# Patient Record
Sex: Female | Born: 1990 | Race: White | Hispanic: No | Marital: Single | Smoking: Current every day smoker
Health system: Southern US, Community
[De-identification: ages and names within clinical notes are randomized; demographics above are authoritative.]

## PROBLEM LIST (undated history)

## (undated) DIAGNOSIS — F419 Anxiety disorder, unspecified: Secondary | ICD-10-CM

## (undated) DIAGNOSIS — J45909 Unspecified asthma, uncomplicated: Secondary | ICD-10-CM

## (undated) HISTORY — PX: APPENDECTOMY: SHX54

---
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
## 2012-04-25 ENCOUNTER — Emergency Department: Payer: Self-pay | Admitting: Emergency Medicine

## 2012-04-25 LAB — URINALYSIS, COMPLETE
Bacteria: NONE SEEN
Blood: NEGATIVE
Glucose,UR: NEGATIVE mg/dL (ref 0–75)
Ph: 6 (ref 4.5–8.0)
Protein: NEGATIVE
RBC,UR: 1 /HPF (ref 0–5)
Specific Gravity: 1.02 (ref 1.003–1.030)
Squamous Epithelial: 3
WBC UR: 1 /HPF (ref 0–5)

## 2012-04-25 LAB — WET PREP, GENITAL

## 2012-08-25 ENCOUNTER — Emergency Department (HOSPITAL_COMMUNITY)
Admission: EM | Admit: 2012-08-25 | Discharge: 2012-08-25 | Disposition: A | Discharge status: Home / Self Care | Payer: Self-pay | Attending: Emergency Medicine | Admitting: Emergency Medicine

## 2012-08-25 ENCOUNTER — Encounter (HOSPITAL_COMMUNITY): Payer: Self-pay

## 2012-08-25 DIAGNOSIS — J45909 Unspecified asthma, uncomplicated: Secondary | ICD-10-CM | POA: Insufficient documentation

## 2012-08-25 DIAGNOSIS — F17200 Nicotine dependence, unspecified, uncomplicated: Secondary | ICD-10-CM | POA: Insufficient documentation

## 2012-08-25 DIAGNOSIS — Z3202 Encounter for pregnancy test, result negative: Secondary | ICD-10-CM | POA: Insufficient documentation

## 2012-08-25 DIAGNOSIS — N949 Unspecified condition associated with female genital organs and menstrual cycle: Secondary | ICD-10-CM | POA: Insufficient documentation

## 2012-08-25 DIAGNOSIS — N760 Acute vaginitis: Secondary | ICD-10-CM | POA: Insufficient documentation

## 2012-08-25 DIAGNOSIS — N898 Other specified noninflammatory disorders of vagina: Secondary | ICD-10-CM | POA: Insufficient documentation

## 2012-08-25 DIAGNOSIS — A499 Bacterial infection, unspecified: Secondary | ICD-10-CM | POA: Insufficient documentation

## 2012-08-25 DIAGNOSIS — B9689 Other specified bacterial agents as the cause of diseases classified elsewhere: Secondary | ICD-10-CM | POA: Insufficient documentation

## 2012-08-25 HISTORY — DX: Unspecified asthma, uncomplicated: J45.909

## 2012-08-25 LAB — URINALYSIS, ROUTINE W REFLEX MICROSCOPIC
Glucose, UA: NEGATIVE mg/dL
Hgb urine dipstick: NEGATIVE
Hgb urine dipstick: NEGATIVE
Leukocytes, UA: NEGATIVE
Protein, ur: NEGATIVE mg/dL
Specific Gravity, Urine: >1.03 — ABNORMAL HIGH (ref 1.005–1.030)
Specific Gravity, Urine: 1.025 (ref 1.005–1.030)
pH: 6 (ref 5.0–8.0)
pH: 7 (ref 5.0–8.0)

## 2012-08-25 LAB — WET PREP, GENITAL: Trich, Wet Prep: NONE SEEN

## 2012-08-25 LAB — URINE MICROSCOPIC-ADD ON

## 2012-08-25 MED ORDER — NAPROXEN 500 MG PO TABS: 500.0000 mg | ORAL_TABLET | Freq: Two times a day (BID) | ORAL | Status: DC

## 2012-08-25 MED ORDER — METRONIDAZOLE 500 MG PO TABS: 500.0000 mg | ORAL_TABLET | Freq: Two times a day (BID) | ORAL | Status: DC

## 2012-08-25 NOTE — ED Notes (Signed)
States that she is having burning with urination.  States that she is having clear-yellow vaginal discharge.  States that she is having lower abdominal pain and vaginal pain.

## 2012-08-25 NOTE — ED Notes (Signed)
Pt c/o burning with urination, vaginal itching, and pelvic pain.  Reports has had some vaginal discharge with a strong odor x 1 week.

## 2012-08-26 LAB — URINE CULTURE: Colony Count: 6000

## 2012-08-26 NOTE — ED Provider Notes (Signed)
CSN: [id number]     Arrival date & time 08/25/12  1131 History     First MD Initiated Contact with Patient 08/25/12 1224     Chief Complaint  Patient presents with  . Dysuria   (Consider location/radiation/quality/duration/timing/severity/associated sxs/prior Treatment) Patient is a 22 y.o. female presenting with dysuria. The history is provided by the patient.  Dysuria Pain quality:  Aching Pain severity:  Mild Onset quality:  Gradual Duration:  1 week Timing:  Constant Progression:  Unchanged Chronicity:  New Recent urinary tract infections: no   Relieved by:  Nothing Worsened by:  Nothing tried Urinary symptoms: foul-smelling urine and frequent urination   Urinary symptoms: no hematuria, no hesitancy and no bladder incontinence   Associated symptoms: vaginal discharge   Associated symptoms: no abdominal pain, no fever, no flank pain, no genital lesions, no nausea and no vomiting   Associated symptoms comment:  Pelvic cramping Vaginal discharge:    Quality:  Wallace Cullens, watery and white   Severity:  Mild   Timing:  Constant   Progression:  Unchanged   Chronicity:  New Risk factors: sexually active   Risk factors: no hx of pyelonephritis, not pregnant, no recurrent urinary tract infections and no sexually transmitted infections     Past Medical History  Diagnosis Date  . Asthma    Past Surgical History  Procedure Laterality Date  . Appendectomy     No family history on file. History  Substance Use Topics  . Smoking status: Current Every Day Smoker  . Smokeless tobacco: Not on file  . Alcohol Use: No   OB History   Grav Para Term Preterm Abortions TAB SAB Ect Mult Living                 Review of Systems  Constitutional: Negative for fever, activity change and appetite change.  Respiratory: Negative for shortness of breath.   Cardiovascular: Negative for chest pain.  Gastrointestinal: Negative for nausea, vomiting and abdominal pain.  Genitourinary: Positive  for dysuria, vaginal discharge, vaginal pain and pelvic pain. Negative for hematuria, flank pain, decreased urine volume, vaginal bleeding, difficulty urinating and genital sores.  Musculoskeletal: Negative for back pain.  Skin: Negative for rash.  Neurological: Negative for dizziness.  All other systems reviewed and are negative.    Allergies  Review of patient's allergies indicates no known allergies.  Home Medications   Current Outpatient Rx  Name  Route  Sig  Dispense  Refill  . metroNIDAZOLE (FLAGYL) 500 MG tablet   Oral   Take 1 tablet (500 mg total) by mouth 2 (two) times daily. For 10 days   14 tablet   0   . naproxen (NAPROSYN) 500 MG tablet   Oral   Take 1 tablet (500 mg total) by mouth 2 (two) times daily with a meal.   20 tablet   0    BP 139/63  Pulse 94  Temp(Src) 98.2 F (36.8 C) (Oral)  Resp 18  Ht 6\' 1"  (1.854 m)  Wt 200 lb (90.719 kg)  BMI 26.39 kg/m2  SpO2 99%  LMP 07/13/2012 Physical Exam  Nursing note and vitals reviewed. Constitutional: She is oriented to person, place, and time. She appears well-developed and well-nourished. No distress.  HENT:  Head: Normocephalic and atraumatic.  Mouth/Throat: Oropharynx is clear and moist.  Neck: Normal range of motion. Neck supple.  Cardiovascular: Normal rate, regular rhythm and normal heart sounds.   Pulmonary/Chest: Effort normal and breath sounds normal. No respiratory distress.  She exhibits no tenderness.  Abdominal: Soft. She exhibits no distension. There is no tenderness. There is no rebound and no guarding.  Genitourinary: Uterus normal. There is no lesion on the right labia. There is no lesion on the left labia. Cervix exhibits no motion tenderness, no discharge and no friability. Right adnexum displays no mass and no tenderness. Left adnexum displays no mass and no tenderness. Vaginal discharge found.  Musculoskeletal: Normal range of motion. She exhibits no tenderness.  Lymphadenopathy:    She  has no cervical adenopathy.  Neurological: She is alert and oriented to person, place, and time. She exhibits normal muscle tone. Coordination normal.  Skin: Skin is warm and dry.    ED Course   Procedures (including critical care time)  Results for orders placed during the hospital encounter of 08/25/12  GC/CHLAMYDIA PROBE AMP      Result Value Range   CT Probe RNA NEGATIVE  NEGATIVE   GC Probe RNA NEGATIVE  NEGATIVE  WET PREP, GENITAL      Result Value Range   Yeast Wet Prep HPF POC NONE SEEN  NONE SEEN   Trich, Wet Prep NONE SEEN  NONE SEEN   Clue Cells Wet Prep HPF POC MANY (*) NONE SEEN   WBC, Wet Prep HPF POC MODERATE (*) NONE SEEN  URINALYSIS, ROUTINE W REFLEX MICROSCOPIC      Result Value Range   Color, Urine YELLOW  YELLOW   APPearance CLEAR  CLEAR   Specific Gravity, Urine >1.030 (*) 1.005 - 1.030   pH 6.0  5.0 - 8.0   Glucose, UA NEGATIVE  NEGATIVE mg/dL   Hgb urine dipstick NEGATIVE  NEGATIVE   Bilirubin Urine SMALL (*) NEGATIVE   Ketones, ur TRACE (*) NEGATIVE mg/dL   Protein, ur NEGATIVE  NEGATIVE mg/dL   Urobilinogen, UA 0.2  0.0 - 1.0 mg/dL   Nitrite NEGATIVE  NEGATIVE   Leukocytes, UA SMALL (*) NEGATIVE  PREGNANCY, URINE      Result Value Range   Preg Test, Ur NEGATIVE  NEGATIVE  URINE MICROSCOPIC-ADD ON      Result Value Range   Squamous Epithelial / LPF MANY (*) RARE   WBC, UA 3-6  <3 WBC/hpf   Bacteria, UA MANY (*) RARE  URINALYSIS, ROUTINE W REFLEX MICROSCOPIC      Result Value Range   Color, Urine YELLOW  YELLOW   APPearance CLEAR  CLEAR   Specific Gravity, Urine 1.025  1.005 - 1.030   pH 7.0  5.0 - 8.0   Glucose, UA NEGATIVE  NEGATIVE mg/dL   Hgb urine dipstick NEGATIVE  NEGATIVE   Bilirubin Urine NEGATIVE  NEGATIVE   Ketones, ur NEGATIVE  NEGATIVE mg/dL   Protein, ur NEGATIVE  NEGATIVE mg/dL   Urobilinogen, UA 1.0  0.0 - 1.0 mg/dL   Nitrite NEGATIVE  NEGATIVE   Leukocytes, UA NEGATIVE  NEGATIVE     1. Bacterial vaginosis     MDM   GC and chlamydia cultures pending.  Repeat u/a was a cath specimen and negative for UTI.    Patient is well appearing, VSS.  No abdominal pain, fever, vomiting or vaginal bleeding.  Will treat for BV , she agrees to f/u with the health dept.    Barby Colvard L. Tyrease Vandeberg, PA-C 08/26/12 2030

## 2012-08-30 NOTE — ED Provider Notes (Signed)
Medical screening examination/treatment/procedure(s) were performed by non-physician practitioner and as supervising physician I was immediately available for consultation/collaboration.   Charles B. Sheldon, MD 08/30/12 1933 

## 2013-09-08 ENCOUNTER — Encounter (HOSPITAL_COMMUNITY): Payer: Self-pay | Admitting: Emergency Medicine

## 2013-09-08 ENCOUNTER — Emergency Department (HOSPITAL_COMMUNITY)
Admission: EM | Admit: 2013-09-08 | Discharge: 2013-09-08 | Disposition: A | Discharge status: Home / Self Care | Payer: Self-pay | Attending: Emergency Medicine | Admitting: Emergency Medicine

## 2013-09-08 DIAGNOSIS — Z8659 Personal history of other mental and behavioral disorders: Secondary | ICD-10-CM | POA: Insufficient documentation

## 2013-09-08 DIAGNOSIS — IMO0002 Reserved for concepts with insufficient information to code with codable children: Secondary | ICD-10-CM

## 2013-09-08 DIAGNOSIS — Y929 Unspecified place or not applicable: Secondary | ICD-10-CM | POA: Insufficient documentation

## 2013-09-08 DIAGNOSIS — Z3202 Encounter for pregnancy test, result negative: Secondary | ICD-10-CM | POA: Insufficient documentation

## 2013-09-08 DIAGNOSIS — R109 Unspecified abdominal pain: Secondary | ICD-10-CM | POA: Insufficient documentation

## 2013-09-08 DIAGNOSIS — S3140XA Unspecified open wound of vagina and vulva, initial encounter: Secondary | ICD-10-CM | POA: Insufficient documentation

## 2013-09-08 DIAGNOSIS — F17200 Nicotine dependence, unspecified, uncomplicated: Secondary | ICD-10-CM | POA: Insufficient documentation

## 2013-09-08 DIAGNOSIS — Z792 Long term (current) use of antibiotics: Secondary | ICD-10-CM | POA: Insufficient documentation

## 2013-09-08 DIAGNOSIS — Y9389 Activity, other specified: Secondary | ICD-10-CM | POA: Insufficient documentation

## 2013-09-08 DIAGNOSIS — W268XXA Contact with other sharp object(s), not elsewhere classified, initial encounter: Secondary | ICD-10-CM | POA: Insufficient documentation

## 2013-09-08 DIAGNOSIS — J45909 Unspecified asthma, uncomplicated: Secondary | ICD-10-CM | POA: Insufficient documentation

## 2013-09-08 HISTORY — DX: Anxiety disorder, unspecified: F41.9

## 2013-09-08 LAB — URINALYSIS, ROUTINE W REFLEX MICROSCOPIC
Bilirubin Urine: NEGATIVE
GLUCOSE, UA: NEGATIVE mg/dL
HGB URINE DIPSTICK: NEGATIVE
KETONES UR: NEGATIVE mg/dL
Leukocytes, UA: NEGATIVE
Nitrite: NEGATIVE
PH: 6 (ref 5.0–8.0)
PROTEIN: NEGATIVE mg/dL
Specific Gravity, Urine: 1.025 (ref 1.005–1.030)
Urobilinogen, UA: 0.2 mg/dL (ref 0.0–1.0)

## 2013-09-08 LAB — POC URINE PREG, ED: Preg Test, Ur: NEGATIVE

## 2013-09-08 MED ORDER — DOXYCYCLINE HYCLATE 100 MG PO TABS
100.0000 mg | ORAL_TABLET | Freq: Once | ORAL | Status: AC
  Administered 2013-09-08: 100 mg via ORAL
  Filled 2013-09-08: qty 1

## 2013-09-08 MED ORDER — KETOROLAC TROMETHAMINE 10 MG PO TABS
10.0000 mg | ORAL_TABLET | Freq: Once | ORAL | Status: AC
  Administered 2013-09-08: 10 mg via ORAL
  Filled 2013-09-08: qty 1

## 2013-09-08 MED ORDER — MELOXICAM 7.5 MG PO TABS: ORAL_TABLET | ORAL | Status: DC

## 2013-09-08 MED ORDER — SULFAMETHOXAZOLE-TRIMETHOPRIM 800-160 MG PO TABS: 1.0000 | ORAL_TABLET | Freq: Two times a day (BID) | ORAL | Status: AC

## 2013-09-08 NOTE — ED Notes (Signed)
Patient with c/o laceration to vaginal area. States she cut with razor blade

## 2013-09-08 NOTE — ED Notes (Signed)
Pt called nurse in room and stated that she just started having a sudden onset of  lower abdominal pain.

## 2013-09-08 NOTE — ED Provider Notes (Signed)
CSN: [id number]     Arrival date & time 09/08/13  1135 History   First MD Initiated Contact with Patient 09/08/13 1254     Chief Complaint  Patient presents with  . Laceration     (Consider location/radiation/quality/duration/timing/severity/associated sxs/prior Treatment) HPI Comments: The patient is a 23 year old female presents to the emergency department with a complaint of laceration to the vaginal area. The patient states that she was shaving when she accidentally cut the inner aspect of the inner labia with a razor blade. This happened on last evening. The patient states that she is now having some lower abdomen area discomfort. And she is concerned for possible infection of the area. There is no bleeding appreciated. The patient has not had any high temperature elevations. She is not on any anticoagulation medications. She presents now for evaluation of these problems.  The history is provided by the patient.    Past Medical History  Diagnosis Date  . Asthma   . Anxiety    Past Surgical History  Procedure Laterality Date  . Appendectomy     No family history on file. History  Substance Use Topics  . Smoking status: Current Every Day Smoker -- 0.50 packs/day    Types: Cigarettes  . Smokeless tobacco: Not on file  . Alcohol Use: No   OB History   Grav Para Term Preterm Abortions TAB SAB Ect Mult Living                 Review of Systems  Constitutional: Negative for activity change.       All ROS Neg except as noted in HPI  HENT: Negative for nosebleeds.   Eyes: Negative for photophobia and discharge.  Respiratory: Negative for cough, shortness of breath and wheezing.   Cardiovascular: Negative for chest pain and palpitations.  Gastrointestinal: Positive for abdominal pain. Negative for blood in stool.  Genitourinary: Positive for vaginal pain. Negative for dysuria, frequency and hematuria.  Musculoskeletal: Negative for arthralgias, back pain and neck pain.  Skin:  Negative.   Neurological: Negative for dizziness, seizures and speech difficulty.  Psychiatric/Behavioral: Negative for hallucinations and confusion.      Allergies  Review of patient's allergies indicates no known allergies.  Home Medications   Prior to Admission medications   Medication Sig Start Date End Date Taking? Authorizing Provider  amoxicillin (AMOXIL) 500 MG capsule Take 500 mg by mouth 3 (three) times daily.   Yes Historical Provider, MD   BP 146/84  Pulse 87  Temp(Src) 99.1 F (37.3 C) (Oral)  Resp 15  Ht 6\' 1"  (1.854 m)  Wt 217 lb (98.431 kg)  BMI 28.64 kg/m2  SpO2 100%  LMP 08/29/2013 Physical Exam  Nursing note and vitals reviewed. Constitutional: She is oriented to person, place, and time. She appears well-developed and well-nourished.  Non-toxic appearance.  HENT:  Head: Normocephalic.  Right Ear: Tympanic membrane and external ear normal.  Left Ear: Tympanic membrane and external ear normal.  Eyes: EOM and lids are normal. Pupils are equal, round, and reactive to light.  Neck: Normal range of motion. Neck supple. Carotid bruit is not present.  Cardiovascular: Normal rate, regular rhythm, normal heart sounds, intact distal pulses and normal pulses.   Pulmonary/Chest: Breath sounds normal. No respiratory distress.  Abdominal: Soft. Bowel sounds are normal. There is no tenderness. There is no guarding.  Mild pain to palpation just above the suprapubic area. No right or left lower quadrant pain. No pain with flexing of the psoas.  Genitourinary:  Chaperone present during the examination. There's no deformity or bleeding of the external or internal labia. There is a ceiling shallow laceration of the inter aspect of the left labia. Mild soreness present. There no tender lymph nodes noted in the inguinal chain. Minimal vaginal discharge present.  Musculoskeletal: Normal range of motion.  Lymphadenopathy:       Head (right side): No submandibular adenopathy  present.       Head (left side): No submandibular adenopathy present.    She has no cervical adenopathy.  Neurological: She is alert and oriented to person, place, and time. She has normal strength. No cranial nerve deficit or sensory deficit.  Skin: Skin is warm and dry.  Psychiatric: She has a normal mood and affect. Her speech is normal.    ED Course  Procedures (including critical care time) Labs Review Labs Reviewed  URINALYSIS, ROUTINE W REFLEX MICROSCOPIC - Abnormal; Notable for the following:    APPearance CLOUDY (*)    All other components within normal limits  POC URINE PREG, ED    Imaging Review No results found.   EKG Interpretation None      MDM Temperature is 99.1 pulse rate is 87 respiratory rate is 15 blood pressure 146/84. Pulse oximetry is 100% on room air. Within normal limits by my interpretation.  Pregnancy test is negative. Urinalysis shows a cloudy yellow specimen is well within normal limits.  I have reassured the patient that the pain in her lower abdomen is not related to pregnancy, urinary tract infection, or kidney stone. I evaluated the laceration to the labia and it is already beginning to heal. I reassured the patient of this as well. We'll cover the patient with Bactrim for possible methicillin-resistant staph arias exposure. Patient is advised to see her primary physician or return to the emergency department if any changes, problems, or concerns.    Final diagnoses:  None    I have reviewed nursing notes, vital signs, and all appropriate lab and imaging results for this patient    Kathie Dike, Cordelia Poche 09/09/13 1744

## 2013-09-08 NOTE — Discharge Instructions (Signed)
Your urine pregnancy test is negative. Your urine analysis is negative for acute infection, or kidney stone.  The laceration to the labia is healing nicely. There is no evidence of abscess. Please use the Bactrim 2 times daily with food for prevention of infection. Please use the Mobic 2 times daily to assist with your discomfort. Please take this medication with food. Please see your primary physician, or return to the emergency department if any changes, signs of secondary infection, or problems.

## 2013-09-11 NOTE — ED Provider Notes (Signed)
Medical screening examination/treatment/procedure(s) were performed by non-physician practitioner and as supervising physician I was immediately available for consultation/collaboration.   EKG Interpretation None       Chelesa Weingartner, MD 09/11/13 1345 

## 2013-10-27 ENCOUNTER — Encounter (HOSPITAL_COMMUNITY): Payer: Self-pay | Admitting: Emergency Medicine

## 2013-10-27 ENCOUNTER — Emergency Department (HOSPITAL_COMMUNITY)
Admission: EM | Admit: 2013-10-27 | Discharge: 2013-10-27 | Disposition: A | Discharge status: Home / Self Care | Payer: Self-pay | Attending: Emergency Medicine | Admitting: Emergency Medicine

## 2013-10-27 DIAGNOSIS — K029 Dental caries, unspecified: Secondary | ICD-10-CM | POA: Insufficient documentation

## 2013-10-27 DIAGNOSIS — K0889 Other specified disorders of teeth and supporting structures: Secondary | ICD-10-CM

## 2013-10-27 DIAGNOSIS — Z8659 Personal history of other mental and behavioral disorders: Secondary | ICD-10-CM | POA: Insufficient documentation

## 2013-10-27 DIAGNOSIS — Z720 Tobacco use: Secondary | ICD-10-CM | POA: Insufficient documentation

## 2013-10-27 DIAGNOSIS — K008 Other disorders of tooth development: Secondary | ICD-10-CM | POA: Insufficient documentation

## 2013-10-27 DIAGNOSIS — J45909 Unspecified asthma, uncomplicated: Secondary | ICD-10-CM | POA: Insufficient documentation

## 2013-10-27 MED ORDER — AMOXICILLIN 500 MG PO CAPS: 500.0000 mg | ORAL_CAPSULE | Freq: Three times a day (TID) | ORAL | Status: AC

## 2013-10-27 MED ORDER — IBUPROFEN 800 MG PO TABS: 800.0000 mg | ORAL_TABLET | Freq: Three times a day (TID) | ORAL | Status: AC

## 2013-10-27 MED ORDER — TRAMADOL HCL 50 MG PO TABS: ORAL_TABLET | ORAL | Status: AC

## 2013-10-27 NOTE — Discharge Instructions (Signed)

## 2013-10-27 NOTE — ED Notes (Signed)
Upper lt molar dental pain, for 2 weeks

## 2013-10-27 NOTE — ED Provider Notes (Signed)
CSN: [id number]     Arrival date & time 10/27/13  1346 History   First MD Initiated Contact with Patient 10/27/13 1507     Chief Complaint  Patient presents with  . Dental Pain     (Consider location/radiation/quality/duration/timing/severity/associated sxs/prior Treatment) Patient is a 23 y.o. female presenting with tooth pain. The history is provided by the patient.  Dental Pain Location:  Upper Quality:  Throbbing and aching Severity:  Severe Onset quality:  Gradual Duration:  2 weeks Timing:  Intermittent Progression:  Worsening Chronicity:  Chronic Context: dental caries and poor dentition   Context: not abscess   Relieved by:  Nothing Worsened by:  Nothing tried Ineffective treatments: goody powders. Associated symptoms: gum swelling and headaches   Associated symptoms: no difficulty swallowing, no drooling, no fever and no neck pain   Risk factors: smoking   Risk factors: no immunosuppression     Past Medical History  Diagnosis Date  . Asthma   . Anxiety    Past Surgical History  Procedure Laterality Date  . Appendectomy     History reviewed. No pertinent family history. History  Substance Use Topics  . Smoking status: Current Every Day Smoker -- 0.50 packs/day    Types: Cigarettes  . Smokeless tobacco: Not on file  . Alcohol Use: No   OB History   Grav Para Term Preterm Abortions TAB SAB Ect Mult Living                 Review of Systems  Constitutional: Negative for fever and activity change.       All ROS Neg except as noted in HPI  HENT: Positive for dental problem. Negative for drooling and nosebleeds.   Eyes: Negative for photophobia and discharge.  Respiratory: Negative for cough, shortness of breath and wheezing.   Cardiovascular: Negative for chest pain and palpitations.  Gastrointestinal: Negative for abdominal pain and blood in stool.  Genitourinary: Negative for dysuria, frequency and hematuria.  Musculoskeletal: Negative for  arthralgias, back pain and neck pain.  Skin: Negative.   Neurological: Positive for headaches. Negative for dizziness, seizures and speech difficulty.  Psychiatric/Behavioral: Negative for hallucinations and confusion.      Allergies  Review of patient's allergies indicates no known allergies.  Home Medications   Prior to Admission medications   Medication Sig Start Date End Date Taking? Authorizing Provider  Aspirin-Acetaminophen-Caffeine (GOODY HEADACHE PO) Take 1 packet by mouth every 4 (four) hours as needed (pain).   Yes Historical Provider, MD   BP 137/77  Pulse 93  Temp(Src) 98 F (36.7 C) (Oral)  Ht 6\' 1"  (1.854 m)  Wt 231 lb (104.781 kg)  BMI 30.48 kg/m2  SpO2 100%  LMP 09/27/2013 Physical Exam  Nursing note and vitals reviewed. Constitutional: She is oriented to person, place, and time. She appears well-developed and well-nourished.  Non-toxic appearance.  HENT:  Head: Normocephalic.  Right Ear: Tympanic membrane and external ear normal.  Left Ear: Tympanic membrane and external ear normal.  Mouth/Throat: Uvula is midline and oropharynx is clear and moist.    Patient is a dental caries in the left upper molar. There is swelling about the gum, but no visible abscess appreciated. The airway is patent. There is no swelling under the tongue.  Eyes: EOM and lids are normal. Pupils are equal, round, and reactive to light.  Neck: Normal range of motion. Neck supple. Carotid bruit is not present.  Cardiovascular: Normal rate, regular rhythm, normal heart sounds, intact distal pulses  and normal pulses.   Pulmonary/Chest: Breath sounds normal. No respiratory distress.  Abdominal: Soft. Bowel sounds are normal. There is no tenderness. There is no guarding.  Musculoskeletal: Normal range of motion.  Lymphadenopathy:       Head (right side): No submandibular adenopathy present.       Head (left side): No submandibular adenopathy present.    She has no cervical adenopathy.    Neurological: She is alert and oriented to person, place, and time. She has normal strength. No cranial nerve deficit or sensory deficit.  Skin: Skin is warm and dry.  Psychiatric: She has a normal mood and affect. Her speech is normal.    ED Course  Procedures (including critical care time) Labs Review Labs Reviewed - No data to display  Imaging Review No results found.   EKG Interpretation None      MDM  Vital signs are within normal limits. Patient has a dental caries of the left upper jaw area. The airway is patent. There is no evidence for Ludwig's angina.patient will be treated with Amoxil, ibuprofen, and Ultram. Patient is encouraged to see a dentist systems possible.    Final diagnoses:  None    **I have reviewed nursing notes, vital signs, and all appropriate lab and imaging results for this patient.Kathie Dike, PA-C 10/27/13 1536  Kathie Dike, PA-C 11/02/13 1725

## 2013-11-17 NOTE — ED Provider Notes (Signed)
Medical screening examination/treatment/procedure(s) were performed by non-physician practitioner and as supervising physician I was immediately available for consultation/collaboration.   EKG Interpretation None      Samuell Knoble, MD, FACEP   Ayan Heffington L Bonny Vanleeuwen, MD 11/17/13 1449
